# Patient Record
Sex: Male | Born: 1985 | Race: Black or African American | Hispanic: No | Marital: Single | Smoking: Current every day smoker
Health system: Southern US, Community
[De-identification: ages and names within clinical notes are randomized; demographics above are authoritative.]

---
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
## 2007-01-15 ENCOUNTER — Emergency Department (HOSPITAL_COMMUNITY): Admission: EM | Admit: 2007-01-15 | Discharge: 2007-01-16 | Payer: Self-pay | Admitting: Emergency Medicine

## 2007-01-26 ENCOUNTER — Emergency Department (HOSPITAL_COMMUNITY): Admission: EM | Admit: 2007-01-26 | Discharge: 2007-01-26 | Payer: Self-pay | Admitting: Emergency Medicine

## 2007-02-06 ENCOUNTER — Emergency Department (HOSPITAL_COMMUNITY): Admission: EM | Admit: 2007-02-06 | Discharge: 2007-02-07 | Payer: Self-pay | Admitting: Emergency Medicine

## 2007-02-24 ENCOUNTER — Emergency Department (HOSPITAL_COMMUNITY): Admission: EM | Admit: 2007-02-24 | Discharge: 2007-02-24 | Payer: Self-pay | Admitting: Emergency Medicine

## 2007-10-03 ENCOUNTER — Emergency Department (HOSPITAL_COMMUNITY): Admission: EM | Admit: 2007-10-03 | Discharge: 2007-10-03 | Payer: Self-pay | Admitting: Emergency Medicine
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
## 2011-03-01 LAB — RPR: RPR Ser Ql: NONREACTIVE

## 2011-03-17 LAB — RAPID STREP SCREEN (MED CTR MEBANE ONLY): Streptococcus, Group A Screen (Direct): NEGATIVE

## 2011-03-18 LAB — I-STAT 8, (EC8 V) (CONVERTED LAB)
BUN: 9
Bicarbonate: 25.8 — ABNORMAL HIGH
Glucose, Bld: 104 — ABNORMAL HIGH
TCO2: 27
pH, Ven: 7.357 — ABNORMAL HIGH

## 2011-03-18 LAB — URINALYSIS, ROUTINE W REFLEX MICROSCOPIC
Glucose, UA: NEGATIVE
Protein, ur: 30 — AB
Specific Gravity, Urine: 1.027
Urobilinogen, UA: 0.2

## 2011-03-18 LAB — URINE MICROSCOPIC-ADD ON

## 2011-04-17 ENCOUNTER — Encounter: Payer: Self-pay | Admitting: Adult Health

## 2011-04-17 ENCOUNTER — Emergency Department (HOSPITAL_COMMUNITY)
Admission: EM | Admit: 2011-04-17 | Discharge: 2011-04-17 | Disposition: A | Discharge status: Home / Self Care | Payer: Self-pay | Attending: Emergency Medicine | Admitting: Emergency Medicine

## 2011-04-17 ENCOUNTER — Emergency Department (HOSPITAL_COMMUNITY): Payer: Self-pay

## 2011-04-17 DIAGNOSIS — R112 Nausea with vomiting, unspecified: Secondary | ICD-10-CM | POA: Insufficient documentation

## 2011-04-17 DIAGNOSIS — J111 Influenza due to unidentified influenza virus with other respiratory manifestations: Secondary | ICD-10-CM | POA: Insufficient documentation

## 2011-04-17 DIAGNOSIS — B9789 Other viral agents as the cause of diseases classified elsewhere: Secondary | ICD-10-CM | POA: Insufficient documentation

## 2011-04-17 DIAGNOSIS — B349 Viral infection, unspecified: Secondary | ICD-10-CM

## 2011-04-17 MED ORDER — ACETAMINOPHEN 325 MG PO TABS
ORAL_TABLET | ORAL | Status: AC
  Administered 2011-04-17: 22:00:00
  Filled 2011-04-17: qty 3

## 2011-04-17 MED ORDER — IBUPROFEN 800 MG PO TABS
800.0000 mg | ORAL_TABLET | Freq: Once | ORAL | Status: AC
  Administered 2011-04-17: 800 mg via ORAL
  Filled 2011-04-17: qty 1

## 2011-04-17 MED ORDER — ONDANSETRON 8 MG PO TBDP
8.0000 mg | ORAL_TABLET | Freq: Once | ORAL | Status: AC
  Administered 2011-04-17: 8 mg via ORAL
  Filled 2011-04-17: qty 1

## 2011-04-17 MED ORDER — ACETAMINOPHEN 500 MG PO TABS
1000.0000 mg | ORAL_TABLET | Freq: Once | ORAL | Status: AC
  Administered 2011-04-17: 1000 mg via ORAL

## 2011-04-17 NOTE — ED Notes (Signed)
Pt reports vomitting and sore throat and fever since Friday.

## 2011-04-17 NOTE — ED Provider Notes (Signed)
History     CSN: [id number] Arrival date & time: 04/17/2011  8:32 PM   First MD Initiated Contact with Patient 04/17/11 2034      No chief complaint on file.   (Consider location/radiation/quality/duration/timing/severity/associated sxs/prior treatment) Patient is a 25 y.o. male presenting with URI. The history is provided by the patient.  URI The primary symptoms include fever, sore throat, cough, nausea, vomiting and myalgias. Primary symptoms do not include ear pain, swollen glands or rash. The current episode started 3 to 5 days ago.  The sore throat is not accompanied by trouble swallowing.  Symptoms associated with the illness include chills, plugged ear sensation and congestion. The illness is not associated with facial pain or sinus pressure.  Pt states he has had chills, body aches as well. States did not measure temperature at home. Pt denies headache or neck stiffness. Denies abdominal pain. Took over the counter theraflu medications which did not help, last took at 9am this morning.   No past medical history on file.  No past surgical history on file.  No family history on file.  History  Substance Use Topics  . Smoking status: Not on file  . Smokeless tobacco: Not on file  . Alcohol Use: Not on file      Review of Systems  Constitutional: Positive for fever and chills.  HENT: Positive for congestion and sore throat. Negative for ear pain, trouble swallowing, neck pain, neck stiffness, voice change and sinus pressure.   Eyes: Negative.   Respiratory: Positive for cough.   Cardiovascular: Negative.   Gastrointestinal: Positive for nausea and vomiting.  Genitourinary: Negative.   Musculoskeletal: Positive for myalgias.  Skin: Negative for rash.  Neurological: Negative.   Psychiatric/Behavioral: Negative.     Allergies  Review of patient's allergies indicates not on file.  Home Medications  No current outpatient prescriptions on file.  BP 137/85  Pulse  100  Temp(Src) 103 F (39.4 C) (Oral)  Resp 20  SpO2 99%  Physical Exam  Constitutional: He is oriented to person, place, and time. He appears well-developed and well-nourished. He appears distressed.       Uncomfortable appearing  HENT:  Head: Normocephalic and atraumatic.  Eyes: Pupils are equal, round, and reactive to light.  Neck: Neck supple.       No meningismus  Cardiovascular: Normal rate, regular rhythm and normal heart sounds.   Pulmonary/Chest: Effort normal and breath sounds normal. No respiratory distress. He has no wheezes. He has no rales.  Abdominal: Soft. Bowel sounds are normal. There is no tenderness.  Musculoskeletal: Normal range of motion.  Lymphadenopathy:    He has no cervical adenopathy.  Neurological: He is alert and oriented to person, place, and time.  Skin: Skin is warm and dry. No rash noted.    ED Course  Procedures (including critical care time)  Dg Chest 2 View  04/17/2011  *RADIOLOGY REPORT*  Clinical Data: Cough, back pain and fever; history of smoking.  CHEST - 2 VIEW  Comparison: Chest radiograph performed 12/29/2006  Findings: The lungs are well-aerated and clear.  There is no evidence of focal opacification, pleural effusion or pneumothorax.  The heart is normal in size; the mediastinal contour is within normal limits.  No acute osseous abnormalities are seen.  IMPRESSION: No acute cardiopulmonary process seen.  Original Report Authenticated By: Tonia Ghent, M.D.   Pt with flu like symptoms. Temp 103. No neck pain, stiffness, no meningismus. Lungs clear. No tonsillar swelling or exudate. No  cervical adenopathy. Pt given zofran for nausea. Pt able to tolerate fluids in ED. Temp improved with tylenol and motrin. Pt feeling better.  Will d/c home with follow up. Suspect possible influenza.    MDM          Lottie Mussel, PA 04/18/11 [phone]

## 2011-04-21 NOTE — ED Provider Notes (Signed)
Medical screening examination/treatment/procedure(s) were performed by non-physician practitioner and as supervising physician I was immediately available for consultation/collaboration.   Gwyneth Sprout, MD 04/21/11 [phone]

## 2011-12-23 ENCOUNTER — Encounter (HOSPITAL_COMMUNITY): Payer: Self-pay

## 2011-12-23 ENCOUNTER — Emergency Department (HOSPITAL_COMMUNITY)
Admission: EM | Admit: 2011-12-23 | Discharge: 2011-12-23 | Disposition: A | Discharge status: Home / Self Care | Payer: Self-pay | Attending: Emergency Medicine | Admitting: Emergency Medicine

## 2011-12-23 DIAGNOSIS — Z202 Contact with and (suspected) exposure to infections with a predominantly sexual mode of transmission: Secondary | ICD-10-CM | POA: Insufficient documentation

## 2011-12-23 LAB — URINALYSIS, ROUTINE W REFLEX MICROSCOPIC
Glucose, UA: NEGATIVE mg/dL
Nitrite: NEGATIVE
Specific Gravity, Urine: 1.028 (ref 1.005–1.030)
pH: 6.5 (ref 5.0–8.0)

## 2011-12-23 LAB — URINE MICROSCOPIC-ADD ON

## 2011-12-23 LAB — RPR: RPR Ser Ql: NONREACTIVE

## 2011-12-23 MED ORDER — AZITHROMYCIN 250 MG PO TABS
1000.0000 mg | ORAL_TABLET | Freq: Once | ORAL | Status: AC
  Administered 2011-12-23: 1000 mg via ORAL
  Filled 2011-12-23: qty 3
  Filled 2011-12-23: qty 1

## 2011-12-23 MED ORDER — LIDOCAINE HCL (PF) 1 % IJ SOLN
INTRAMUSCULAR | Status: AC
  Administered 2011-12-23: 2 mL
  Filled 2011-12-23: qty 5

## 2011-12-23 MED ORDER — CEFTRIAXONE SODIUM 250 MG IJ SOLR
250.0000 mg | Freq: Once | INTRAMUSCULAR | Status: AC
  Administered 2011-12-23: 250 mg via INTRAMUSCULAR
  Filled 2011-12-23: qty 250

## 2011-12-23 NOTE — ED Notes (Signed)
Pt here for possible std exposure, started after [place] [facility] [place] [facility] sts 3-4 days ago.

## 2011-12-23 NOTE — ED Provider Notes (Signed)
History     CSN: [id number]  Arrival date & time 12/23/11  4098   First MD Initiated Contact with Patient 12/23/11 0840      Chief Complaint  Patient presents with  . SEXUALLY TRANSMITTED DISEASE    (Consider location/radiation/quality/duration/timing/severity/associated sxs/prior treatment) HPI Comments: Patient is a 26 year-old male who presents with a sore throat, resolved testicular pain, and concerns about STD exposure. He first started having a sore throat and testicular pain 5 days ago after having unprotected intercourse with his girlfriend. The testicular pain lasted for a day and a half and was described as a squeezing pain in his testicles. The pain referred to the right and left lower quadrants of his abdomen. The pain resolved and he has not experienced testicular pain in 3 days. The pain in his throat is described as sharp, constant, and a 5 out of 10 on the pain scale. He denies fever and chills. He denies malaise and fatigue. He denies cough and shortness of breath. He denies nausea and vomiting. He denies dysuria, urgency, frequency and hematuria. He denies penile pain and discharge.  The history is provided by the patient.    No past medical history on file.  No past surgical history on file.  No family history on file.  History  Substance Use Topics  . Smoking status: Not on file  . Smokeless tobacco: Not on file  . Alcohol Use: Not on file      Review of Systems  Constitutional: Negative for fever and chills.  HENT: Positive for sore throat. Negative for trouble swallowing.   Respiratory: Negative for cough and shortness of breath.   Cardiovascular: Negative for chest pain.  Gastrointestinal: Negative for nausea, vomiting, abdominal pain and diarrhea.  Genitourinary: Negative for dysuria, discharge, scrotal swelling, penile pain and testicular pain.  All other systems reviewed and are negative.    Allergies  Latex  Home Medications  No current  outpatient prescriptions on file.  BP 129/70  Pulse 74  Temp 98.5 F (36.9 C) (Oral)  Resp 18  SpO2 99%  Physical Exam  Nursing note and vitals reviewed. Constitutional: He is oriented to person, place, and time. He appears well-developed and well-nourished. No distress.  HENT:  Head: Normocephalic and atraumatic.  Mouth/Throat: Oropharynx is clear and moist. No oropharyngeal exudate.  Neck: Neck supple.  Cardiovascular: Normal rate, regular rhythm and normal heart sounds.   Pulmonary/Chest: Breath sounds normal. No respiratory distress. He has no wheezes. He has no rales. He exhibits no tenderness.  Abdominal: Soft. Bowel sounds are normal. He exhibits no distension and no mass. There is no tenderness. There is no rebound and no guarding. Hernia confirmed negative in the right inguinal area and confirmed negative in the left inguinal area.  Genitourinary: Testes normal and penis normal. Right testis shows no mass, no swelling and no tenderness. Left testis shows no mass, no swelling and no tenderness. Circumcised. No penile tenderness. No discharge found.  Lymphadenopathy:       Right: No inguinal adenopathy present.       Left: No inguinal adenopathy present.  Neurological: He is alert and oriented to person, place, and time.  Skin: He is not diaphoretic.    ED Course  Procedures (including critical care time)  Labs Reviewed  URINALYSIS, ROUTINE W REFLEX MICROSCOPIC - Abnormal; Notable for the following:    Color, Urine AMBER (*)  BIOCHEMICALS MAY BE AFFECTED BY COLOR   Leukocytes, UA SMALL (*)  All other components within normal limits  URINE MICROSCOPIC-ADD ON  GC/CHLAMYDIA PROBE AMP, GENITAL  RPR   No results found.   1. Possible exposure to STD       MDM  Patient with sore throat x 5 days and 1.5 days testicular pain (resolved x 3 days) following oral and vaginal intercourse with girlfriend.   Concern for STD exposure.  Pt swabbed for GC/Chlam, treated in ED.   Discussed return precautions.  Pt given STD clinic for further STD and HIV testing.  Pt made aware that his sexual partner(s) should be tested and treated if his tests are positive.          Dillard Cannon [facility]) [place], [place] 12/23/11 1110

## 2011-12-24 NOTE — ED Provider Notes (Signed)
Medical screening examination/treatment/procedure(s) were performed by non-physician practitioner and as supervising physician I was immediately available for consultation/collaboration.   Myrissa Chipley M Desirey Keahey, DO 12/24/11 1018 

## 2012-02-06 ENCOUNTER — Encounter (HOSPITAL_COMMUNITY): Payer: Self-pay | Admitting: Family Medicine

## 2012-02-06 ENCOUNTER — Emergency Department (HOSPITAL_COMMUNITY)
Admission: EM | Admit: 2012-02-06 | Discharge: 2012-02-06 | Disposition: A | Discharge status: Home / Self Care | Payer: No Typology Code available for payment source | Attending: Emergency Medicine | Admitting: Emergency Medicine

## 2012-02-06 DIAGNOSIS — Z043 Encounter for examination and observation following other accident: Secondary | ICD-10-CM | POA: Insufficient documentation

## 2012-02-06 DIAGNOSIS — F17200 Nicotine dependence, unspecified, uncomplicated: Secondary | ICD-10-CM | POA: Insufficient documentation

## 2012-02-06 DIAGNOSIS — M545 Low back pain: Secondary | ICD-10-CM

## 2012-02-06 NOTE — ED Notes (Signed)
No swelling or contusion noted on neck or lower back, pt c/o hitting head on the window without LOC, pt pupils equal & reactive, pt A&O x4, follows commands, speaks in complete sentences
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
## 2012-02-14 ENCOUNTER — Encounter (HOSPITAL_COMMUNITY): Payer: Self-pay | Admitting: *Deleted

## 2012-02-14 ENCOUNTER — Emergency Department (HOSPITAL_COMMUNITY): Payer: No Typology Code available for payment source

## 2012-02-14 ENCOUNTER — Emergency Department (HOSPITAL_COMMUNITY)
Admission: EM | Admit: 2012-02-14 | Discharge: 2012-02-14 | Disposition: A | Discharge status: Home / Self Care | Payer: No Typology Code available for payment source | Attending: Emergency Medicine | Admitting: Emergency Medicine

## 2012-02-14 DIAGNOSIS — Z91040 Latex allergy status: Secondary | ICD-10-CM | POA: Insufficient documentation

## 2012-02-14 DIAGNOSIS — S139XXA Sprain of joints and ligaments of unspecified parts of neck, initial encounter: Secondary | ICD-10-CM

## 2012-02-14 DIAGNOSIS — F17200 Nicotine dependence, unspecified, uncomplicated: Secondary | ICD-10-CM | POA: Insufficient documentation

## 2012-02-14 DIAGNOSIS — S46919A Strain of unspecified muscle, fascia and tendon at shoulder and upper arm level, unspecified arm, initial encounter: Secondary | ICD-10-CM

## 2012-02-14 MED ORDER — NAPROXEN 500 MG PO TABS: 500.0000 mg | ORAL_TABLET | Freq: Two times a day (BID) | ORAL | Status: DC

## 2012-02-14 MED ORDER — NAPROXEN 250 MG PO TABS
500.0000 mg | ORAL_TABLET | Freq: Once | ORAL | Status: AC
  Administered 2012-02-14: 500 mg via ORAL
  Filled 2012-02-14: qty 2

## 2012-02-14 MED ORDER — TRAMADOL HCL 50 MG PO TABS: 50.0000 mg | ORAL_TABLET | Freq: Four times a day (QID) | ORAL | Status: AC | PRN

## 2012-02-14 NOTE — ED Notes (Signed)
Pt states that he feels well enough to "work out" now.
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
## 2012-03-23 ENCOUNTER — Encounter (HOSPITAL_COMMUNITY): Payer: Self-pay | Admitting: Emergency Medicine

## 2012-03-23 ENCOUNTER — Emergency Department (HOSPITAL_COMMUNITY)
Admission: EM | Admit: 2012-03-23 | Discharge: 2012-03-23 | Disposition: A | Discharge status: Home / Self Care | Payer: Self-pay | Attending: Emergency Medicine | Admitting: Emergency Medicine

## 2012-03-23 DIAGNOSIS — S61218A Laceration without foreign body of other finger without damage to nail, initial encounter: Secondary | ICD-10-CM

## 2012-03-23 DIAGNOSIS — Y9389 Activity, other specified: Secondary | ICD-10-CM | POA: Insufficient documentation

## 2012-03-23 DIAGNOSIS — W268XXA Contact with other sharp object(s), not elsewhere classified, initial encounter: Secondary | ICD-10-CM | POA: Insufficient documentation

## 2012-03-23 DIAGNOSIS — F17200 Nicotine dependence, unspecified, uncomplicated: Secondary | ICD-10-CM | POA: Insufficient documentation

## 2012-03-23 DIAGNOSIS — Y998 Other external cause status: Secondary | ICD-10-CM | POA: Insufficient documentation

## 2012-03-23 DIAGNOSIS — S61209A Unspecified open wound of unspecified finger without damage to nail, initial encounter: Secondary | ICD-10-CM | POA: Insufficient documentation

## 2012-03-23 NOTE — ED Notes (Signed)
Suture cart to bedside. 

## 2012-03-23 NOTE — ED Provider Notes (Signed)
History     CSN: [id number]  Arrival date & time 03/23/12  4098   First MD Initiated Contact with Patient 03/23/12 0136      Chief Complaint  Patient presents with  . Finger Injury   HPI  History provided by the patient. Patient is a 26 year old male with no significant PMH who presents with complaints of laceration to left index finger. Patient was working on project and was using a razor and slipped making a small cut to his lateral left index finger. He denies any weakness or numbness. There was some associated bleeding was controlled with bandage. Patient also treated injury with washing under the sink. He denies any other complaints or injury. Patient believes he had his last tetanus 3 or 4 years ago.     History reviewed. No pertinent past medical history.  History reviewed. No pertinent past surgical history.  No family history on file.  History  Substance Use Topics  . Smoking status: Current Every Day Smoker  . Smokeless tobacco: Not on file  . Alcohol Use: Yes      Review of Systems  Skin:       Laceration to left index finger  Neurological: Negative for weakness and numbness.    Allergies  Latex  Home Medications  No current outpatient prescriptions on file.  BP 125/71  Temp 98.3 F (36.8 C) (Oral)  Resp 18  SpO2 99%  Physical Exam  Nursing note and vitals reviewed. Constitutional: He is oriented to person, place, and time. He appears well-developed and well-nourished. No distress.  HENT:  Head: Normocephalic.  Cardiovascular: Normal rate and regular rhythm.   Pulmonary/Chest: Effort normal and breath sounds normal.  Musculoskeletal:       Laceration to the lateral left index finger. Wound explored with no deep structure involvement through full range of motion. Normal motion. Normal strength against resistance. Normal cap refill. Patient reports slightly decreased sensation to the lateral aspect of the finger distally. Patient still has sharp  and dull touch discrimination.  Neurological: He is alert and oriented to person, place, and time.  Skin: Skin is warm.  Psychiatric: He has a normal mood and affect. His behavior is normal.    ED Course  Procedures  LACERATION REPAIR Performed by: Angus Seller Authorized by: Angus Seller Consent: Verbal consent obtained. Risks and benefits: risks, benefits and alternatives were discussed Consent given by: patient Patient identity confirmed: provided demographic data Prepped and Draped in normal sterile fashion Wound explored  Laceration Location: Left index finger  Laceration Length: 3.5 cm  No Foreign Bodies seen or palpated  Anesthesia: Digital block   Local anesthetic: lidocaine 2% without epinephrine  Anesthetic total: 2 ml  Irrigation method: syringe Amount of cleaning: standard  Skin closure: Skin with 4-0 Prolene   Number of sutures: 3   Technique: Simple interrupted   Patient tolerance: Patient tolerated the procedure well with no immediate complications.      1. Laceration of index finger       MDM  Patient seen and evaluated. Patient appears comfortable no acute distress.        Angus Seller, [place] 03/23/12 [phone]

## 2012-03-23 NOTE — ED Notes (Signed)
PT. REPORTS LEFT INDEX FINGER LACERATION APPROX. 1 INCH SUSTAINED FROM A RAZOR WHILE AT WORK THIS EVENING , NO BLEEDING AT TRIAGE , DRESSING APPLIED PTA.

## 2012-03-23 NOTE — ED Provider Notes (Addendum)
Medical screening examination/treatment/procedure(s) were conducted as a shared visit with non-physician practitioner(s) and myself.   Brandt Loosen, MD 03/23/12 0800  Brandt Loosen, MD 03/23/12 [phone]

## 2012-03-23 NOTE — ED Provider Notes (Signed)
Medical screening examination/treatment/procedure(s) were conducted as a shared visit with non-physician practitioner(s) and myself.    Brandt Loosen, MD 03/23/12 [phone]

## 2012-04-21 ENCOUNTER — Encounter (HOSPITAL_COMMUNITY): Payer: Self-pay | Admitting: Emergency Medicine

## 2012-04-21 ENCOUNTER — Emergency Department (HOSPITAL_COMMUNITY)
Admission: EM | Admit: 2012-04-21 | Discharge: 2012-04-21 | Disposition: A | Discharge status: Home / Self Care | Payer: Self-pay | Attending: Emergency Medicine | Admitting: Emergency Medicine

## 2012-04-21 DIAGNOSIS — F17200 Nicotine dependence, unspecified, uncomplicated: Secondary | ICD-10-CM | POA: Insufficient documentation

## 2012-04-21 DIAGNOSIS — R300 Dysuria: Secondary | ICD-10-CM | POA: Insufficient documentation

## 2012-04-21 DIAGNOSIS — N342 Other urethritis: Secondary | ICD-10-CM | POA: Insufficient documentation

## 2012-04-21 MED ORDER — METRONIDAZOLE 500 MG PO TABS
2000.0000 mg | ORAL_TABLET | Freq: Once | ORAL | Status: AC
  Administered 2012-04-21: 2000 mg via ORAL
  Filled 2012-04-21: qty 4

## 2012-04-21 MED ORDER — ONDANSETRON 4 MG PO TBDP
8.0000 mg | ORAL_TABLET | Freq: Once | ORAL | Status: AC
  Administered 2012-04-21: 8 mg via ORAL
  Filled 2012-04-21: qty 2
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
## 2012-04-21 NOTE — ED Provider Notes (Signed)
History     CSN: [id number]  Arrival date & time 04/21/12  7829   First MD Initiated Contact with Patient 04/21/12 0600      Chief Complaint  Patient presents with  . Penile Discharge    Patient is a 26 y.o. male presenting with dysuria. The history is provided by the patient.  Dysuria  This is a new problem. The current episode started more than 1 week ago. The problem occurs intermittently. The problem has not changed since onset.The quality of the pain is described as burning. The pain is mild. There has been no fever. Associated symptoms include discharge.  pt reports he has noticed some dysuria recently with small amt of penile discharge He reports his girlfriend called him and told him she was diagnosed with trichomonas He presents today for treatment  PMH - none  History reviewed. No pertinent past surgical history.  History reviewed. No pertinent family history.  History  Substance Use Topics  . Smoking status: Current Every Day Smoker  . Smokeless tobacco: Not on file  . Alcohol Use: Yes      Review of Systems  Constitutional: Negative for fever.  Genitourinary: Positive for dysuria.    Allergies  Latex  Home Medications  No current outpatient prescriptions on file.  BP 140/69  Pulse 81  Temp 98 F (36.7 C) (Oral)  Resp 16  SpO2 100%  Physical Exam CONSTITUTIONAL: Well developed/well nourished HEAD AND FACE: Normocephalic/atraumatic EYES: EOMI ENMT: Mucous membranes moist NECK: supple no meningeal signs CV: S1/S2 noted, no murmurs/rubs/gallops noted LUNGS: Lungs are clear to auscultation bilaterally, no apparent distress ABDOMEN: soft, nontender, no rebound or guarding GU:no penile discharge.  No testicular tenderness.  Chaperone present NEURO: Pt is awake/alert, moves all extremitiesx4 EXTREMITIES: pulses normal, full ROM SKIN: warm, color normal   ED Course  Procedures    Labs Reviewed  GC/CHLAMYDIA PROBE AMP    1. Urethritis        MDM  Nursing notes including past medical history and social history reviewed and considered in documentation  GC/chlam test sent.  Will empirically tx for trichomonas here with flagyll.  I advised him to avoid using ETOH while taking this medication        Joya Gaskins, MD 04/21/12 [phone]

## 2012-04-21 NOTE — ED Notes (Signed)
Came back to room with discharge papers in hand. Pt. Was not in room. Pt. Did not receive discharge paperwork. E- signature not obtained.

## 2013-01-01 ENCOUNTER — Emergency Department (HOSPITAL_COMMUNITY)
Admission: EM | Admit: 2013-01-01 | Discharge: 2013-01-01 | Disposition: A | Discharge status: Home / Self Care | Payer: Self-pay | Attending: Emergency Medicine | Admitting: Emergency Medicine

## 2013-01-01 ENCOUNTER — Encounter (HOSPITAL_COMMUNITY): Payer: Self-pay | Admitting: Emergency Medicine

## 2013-01-01 DIAGNOSIS — R52 Pain, unspecified: Secondary | ICD-10-CM | POA: Insufficient documentation

## 2013-01-01 DIAGNOSIS — H9209 Otalgia, unspecified ear: Secondary | ICD-10-CM | POA: Insufficient documentation

## 2013-01-01 DIAGNOSIS — H9201 Otalgia, right ear: Secondary | ICD-10-CM

## 2013-01-01 DIAGNOSIS — Z77098 Contact with and (suspected) exposure to other hazardous, chiefly nonmedicinal, chemicals: Secondary | ICD-10-CM | POA: Insufficient documentation

## 2013-01-01 DIAGNOSIS — F17200 Nicotine dependence, unspecified, uncomplicated: Secondary | ICD-10-CM | POA: Insufficient documentation

## 2013-01-01 DIAGNOSIS — Z91040 Latex allergy status: Secondary | ICD-10-CM | POA: Insufficient documentation

## 2013-01-01 NOTE — ED Provider Notes (Signed)
Medical screening examination/treatment/procedure(s) were performed by non-physician practitioner and as supervising physician I was immediately available for consultation/collaboration.  Doug Sou, MD 01/01/13 [phone]
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
## 2013-02-03 ENCOUNTER — Emergency Department (HOSPITAL_COMMUNITY)
Admission: EM | Admit: 2013-02-03 | Discharge: 2013-02-03 | Disposition: A | Discharge status: Other Acute Inpatient Hospital | Payer: Self-pay | Attending: Emergency Medicine | Admitting: Emergency Medicine

## 2013-02-03 ENCOUNTER — Encounter (HOSPITAL_COMMUNITY): Payer: Self-pay | Admitting: Emergency Medicine

## 2013-02-03 DIAGNOSIS — E876 Hypokalemia: Secondary | ICD-10-CM | POA: Insufficient documentation

## 2013-02-03 DIAGNOSIS — F29 Unspecified psychosis not due to a substance or known physiological condition: Secondary | ICD-10-CM | POA: Insufficient documentation

## 2013-02-03 DIAGNOSIS — F1210 Cannabis abuse, uncomplicated: Secondary | ICD-10-CM | POA: Insufficient documentation

## 2013-02-03 DIAGNOSIS — F1220 Cannabis dependence, uncomplicated: Secondary | ICD-10-CM

## 2013-02-03 DIAGNOSIS — F17200 Nicotine dependence, unspecified, uncomplicated: Secondary | ICD-10-CM | POA: Insufficient documentation

## 2013-02-03 DIAGNOSIS — Z87891 Personal history of nicotine dependence: Secondary | ICD-10-CM | POA: Insufficient documentation

## 2013-02-03 LAB — ETHANOL: Alcohol, Ethyl (B): 78 mg/dL — ABNORMAL HIGH (ref 0–11)

## 2013-02-03 LAB — CBC WITH DIFFERENTIAL/PLATELET
Basophils Absolute: 0 10*3/uL (ref 0.0–0.1)
Eosinophils Absolute: 0.1 10*3/uL (ref 0.0–0.7)
Eosinophils Relative: 1 % (ref 0–5)
HCT: 38.5 % — ABNORMAL LOW (ref 39.0–52.0)
Lymphocytes Relative: 37 % (ref 12–46)
MCH: 28.9 pg (ref 26.0–34.0)
MCV: 83 fL (ref 78.0–100.0)
Monocytes Absolute: 0.8 10*3/uL (ref 0.1–1.0)
RDW: 13.3 % (ref 11.5–15.5)
WBC: 9.8 10*3/uL (ref 4.0–10.5)

## 2013-02-03 LAB — RAPID URINE DRUG SCREEN, HOSP PERFORMED
Amphetamines: NOT DETECTED
Benzodiazepines: NOT DETECTED
Cocaine: NOT DETECTED
Opiates: NOT DETECTED

## 2013-02-03 LAB — COMPREHENSIVE METABOLIC PANEL
AST: 29 U/L (ref 0–37)
CO2: 25 mEq/L (ref 19–32)
Calcium: 8.9 mg/dL (ref 8.4–10.5)
Creatinine, Ser: 1.16 mg/dL (ref 0.50–1.35)
GFR calc Af Amer: >90 mL/min (ref >90)
GFR calc non Af Amer: 86 mL/min — ABNORMAL LOW (ref >90)
Glucose, Bld: 90 mg/dL (ref 70–99)
Total Protein: 7.4 g/dL (ref 6.0–8.3)

## 2013-02-03 MED ORDER — POTASSIUM CHLORIDE CRYS ER 20 MEQ PO TBCR
40.0000 meq | EXTENDED_RELEASE_TABLET | Freq: Once | ORAL | Status: AC
  Administered 2013-02-03: 40 meq via ORAL
  Filled 2013-02-03: qty 2

## 2013-02-03 MED ORDER — NICOTINE 14 MG/24HR TD PT24
14.0000 mg | MEDICATED_PATCH | Freq: Once | TRANSDERMAL | Status: DC
  Administered 2013-02-03: 14 mg via TRANSDERMAL
  Filled 2013-02-03: qty 1
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
## 2013-02-03 NOTE — BH Assessment (Signed)
[facility] Assessment Progress Note Called following facilities in attempt to place pt:  -[place] - no beds per [facility] @ 0959 Earlene Plater - no beds per [place] @ [phone] -[facility] - no beds per Susie @ 1000 -[facility] - no beds per [place] @ 1001 -[facility] - Beds per [place] @ 1002 - Referral faxed for review - [facility] - 1006 - may have beds after 1300 - Referral faxed for review - [facility] - 1004 - No beds per Geri Seminole, but can send referral for consideration for their wait list - Referral faxed for review - [facility] - only male beds per Selena Batten @ 1005 Turner Daniels - beds @ [phone] - Referral faxed for review - [facility] - no beds @ 1007, but can send referral for consideration for wait list per Baxter Hire - Referral faxed for review - [place] - no beds per Katie @ 1003 - [place] - No beds per [place] @ [address] - Left message @ 1012 - [place] - No beds per [facility] @ 1034 - [place] - no beds per Olegario Messier @ 1037 [facility] - 1057 - no beds per [facility]  TTS staff will follow up with referrals.

## 2013-02-03 NOTE — ED Notes (Signed)
Belongings with RN at this time to be inventoried.

## 2013-02-03 NOTE — ED Notes (Signed)
Pt. requesting psychiatric evaluation , pt.stated " I have a lot of things in my mind " , denies suicidal ideation , no visual or auditory hallucinations.
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
## 2016-07-02 ENCOUNTER — Encounter (HOSPITAL_COMMUNITY): Payer: Self-pay

## 2016-07-02 ENCOUNTER — Emergency Department (HOSPITAL_COMMUNITY)
Admission: EM | Admit: 2016-07-02 | Discharge: 2016-07-02 | Disposition: A | Discharge status: Home / Self Care | Payer: Self-pay | Attending: Emergency Medicine | Admitting: Emergency Medicine

## 2016-07-02 DIAGNOSIS — Z91040 Latex allergy status: Secondary | ICD-10-CM | POA: Insufficient documentation

## 2016-07-02 DIAGNOSIS — F10120 Alcohol abuse with intoxication, uncomplicated: Secondary | ICD-10-CM

## 2016-07-02 DIAGNOSIS — F10129 Alcohol abuse with intoxication, unspecified: Secondary | ICD-10-CM | POA: Insufficient documentation

## 2016-07-02 DIAGNOSIS — F17200 Nicotine dependence, unspecified, uncomplicated: Secondary | ICD-10-CM | POA: Insufficient documentation

## 2016-07-02 LAB — CBC WITH DIFFERENTIAL/PLATELET
BASOS ABS: 0 10*3/uL (ref 0.0–0.1)
Basophils Relative: 0 %
EOS PCT: 0 %
Eosinophils Absolute: 0 10*3/uL (ref 0.0–0.7)
HCT: 43.9 % (ref 39.0–52.0)
Hemoglobin: 14.8 g/dL (ref 13.0–17.0)
LYMPHS ABS: 2 10*3/uL (ref 0.7–4.0)
LYMPHS PCT: 22 %
MCH: 27.6 pg (ref 26.0–34.0)
MCHC: 33.7 g/dL (ref 30.0–36.0)
MCV: 81.9 fL (ref 78.0–100.0)
MONO ABS: 0.4 10*3/uL (ref 0.1–1.0)
Monocytes Relative: 4 %
NEUTROS ABS: 6.5 10*3/uL (ref 1.7–7.7)
Neutrophils Relative %: 74 %
PLATELETS: 352 10*3/uL (ref 150–400)
RBC: 5.36 MIL/uL (ref 4.22–5.81)
RDW: 13.5 % (ref 11.5–15.5)
WBC: 9 10*3/uL (ref 4.0–10.5)

## 2016-07-02 LAB — COMPREHENSIVE METABOLIC PANEL
ALT: 22 U/L (ref 17–63)
AST: 35 U/L (ref 15–41)
Albumin: 4.8 g/dL (ref 3.5–5.0)
Alkaline Phosphatase: 89 U/L (ref 38–126)
Anion gap: 14 (ref 5–15)
BILIRUBIN TOTAL: 0.6 mg/dL (ref 0.3–1.2)
BUN: 9 mg/dL (ref 6–20)
CALCIUM: 9.9 mg/dL (ref 8.9–10.3)
CO2: 22 mmol/L (ref 22–32)
CREATININE: 1.11 mg/dL (ref 0.61–1.24)
Chloride: 104 mmol/L (ref 101–111)
GFR calc Af Amer: >60 mL/min (ref >60)
Glucose, Bld: 103 mg/dL — ABNORMAL HIGH (ref 65–99)
Potassium: 3.6 mmol/L (ref 3.5–5.1)
Sodium: 140 mmol/L (ref 135–145)
TOTAL PROTEIN: 8.4 g/dL — AB (ref 6.5–8.1)

## 2016-07-02 LAB — LIPASE, BLOOD: LIPASE: 16 U/L (ref 11–51)

## 2016-07-02 MED ORDER — ONDANSETRON 4 MG PO TBDP: 4.0000 mg | ORAL_TABLET | Freq: Three times a day (TID) | ORAL | 0 refills | Status: AC | PRN

## 2016-07-02 MED ORDER — SODIUM CHLORIDE 0.9 % IV BOLUS (SEPSIS)
1000.0000 mL | Freq: Once | INTRAVENOUS | Status: AC
  Administered 2016-07-02: 1000 mL via INTRAVENOUS

## 2016-07-02 MED ORDER — ONDANSETRON HCL 4 MG/2ML IJ SOLN
4.0000 mg | Freq: Once | INTRAMUSCULAR | Status: AC
  Administered 2016-07-02: 4 mg via INTRAVENOUS
  Filled 2016-07-02: qty 2

## 2016-07-02 NOTE — ED Provider Notes (Signed)
[facility]EMERGENCY DEPT Provider Note   CSN: [id number] Arrival date & time: 07/02/16  0917     History   Chief Complaint Chief Complaint  Patient presents with  . etoh/nausea    HPI Tony Moore is a 31 y.o. male.  HPI   31 year old male presents with concern for nausea and vomiting, dizziness after drinking alcohol all night. Friend at the bedside reports he started drinking at approximately 8 PM, and there are drinking until approximately 5 AM. Round 5 AM he vomited once, and then 1 hour later he developed hyperventilation and was vomiting again, several times. Patient reports dizziness and abdominal pain. Friend brought him in given his hyperventilation, abdominal pain, dizziness with concern for "alcohol poisoning"  they deny other drug use, medication use. Denied trauma.  History reviewed. No pertinent past medical history.  There are no active problems to display for this patient.   History reviewed. No pertinent surgical history.     Home Medications    Prior to Admission medications   Medication Sig Start Date End Date Taking? Authorizing Provider  ondansetron (ZOFRAN ODT) 4 MG disintegrating tablet Take 1 tablet (4 mg total) by mouth every 8 (eight) hours as needed for nausea or vomiting. 07/02/16   Alvira MondayErin Micajah Dennin, MD    Family History No family history on file.  Social History Social History  Substance Use Topics  . Smoking status: Current Every Day Smoker  . Smokeless tobacco: Not on file  . Alcohol use Yes     Allergies   Pork-derived products and Latex   Review of Systems Review of Systems  Constitutional: Negative for fever.  HENT: Negative for sore throat.   Eyes: Negative for visual disturbance.  Respiratory: Negative for shortness of breath (was hyperventilating but no longer).   Cardiovascular: Negative for chest pain.  Gastrointestinal: Positive for abdominal pain, nausea and vomiting.  Genitourinary: Negative for difficulty  urinating.  Musculoskeletal: Negative for back pain and neck stiffness.  Skin: Negative for rash.  Neurological: Positive for dizziness. Negative for syncope.     Physical Exam Updated Vital Signs BP 124/80   Pulse (!) 47   Temp 97.3 F (36.3 C) (Oral)   Resp 16   SpO2 99%   Physical Exam  Constitutional: He is oriented to person, place, and time. He appears well-developed and well-nourished. He appears ill. No distress.  In bed with eyes closed  HENT:  Head: Normocephalic and atraumatic.  Eyes: Conjunctivae and EOM are normal.  Neck: Normal range of motion.  Cardiovascular: Normal rate, regular rhythm, normal heart sounds and intact distal pulses.  Exam reveals no gallop and no friction rub.   No murmur heard. Pulmonary/Chest: Effort normal and breath sounds normal. No respiratory distress. He has no wheezes. He has no rales.  Abdominal: Soft. He exhibits no distension. There is no tenderness (reports palpation makes him nauseas). There is no guarding.  Musculoskeletal: He exhibits no edema.  Neurological: He is alert and oriented to person, place, and time.  Skin: Skin is warm and dry. He is not diaphoretic.  Nursing note and vitals reviewed.    ED Treatments / Results  Labs (all labs ordered are listed, but only abnormal results are displayed) Labs Reviewed  COMPREHENSIVE METABOLIC PANEL - Abnormal; Notable for the following:       Result Value   Glucose, Bld 103 (*)    Total Protein 8.4 (*)    All other components within normal limits  CBC WITH DIFFERENTIAL/PLATELET  LIPASE,  BLOOD    EKG  EKG Interpretation None       Radiology No results found.  Procedures Procedures (including critical care time)  Medications Ordered in ED Medications  sodium chloride 0.9 % bolus 1,000 mL (0 mLs Intravenous Stopped 07/02/16 1143)  ondansetron (ZOFRAN) injection 4 mg (4 mg Intravenous Given 07/02/16 1033)     Initial Impression / Assessment and Plan / ED Course    I have reviewed the triage vital signs and the nursing notes.  Pertinent labs & imaging results that were available during my care of the patient were reviewed by me and considered in my medical decision making (see chart for details).     31 year old male with hx of psychosis presents with concern for nausea and vomiting, dizziness after drinking alcohol last night until 5AM.  Patient reportedly had been hyperventilating prior to arrival, however is not on my exam. Suspect this is likely secondary to his nausea. Labs are obtained given his abdominal pain to evaluate for signs of alcoholic hepatitis or pancreatitis and showed no acute abnormalities. Patient was given IV fluids, Zofran. Suspect his symptoms are veisalgia (or secondary to a hangover.)  Patient feels improved after fluids and zofran. Labs WNL.  Will discharge home with rx for zofran, recommend continued hydration, avoidance of etoh/binging. Pt denies SI/HI/hallucinations.   Final Clinical Impressions(s) / ED Diagnoses   Final diagnoses:  Hangover without complication (HCC)    New Prescriptions New Prescriptions   ONDANSETRON (ZOFRAN ODT) 4 MG DISINTEGRATING TABLET    Take 1 tablet (4 mg total) by mouth every 8 (eight) hours as needed for nausea or vomiting.     Alvira Monday, MD 07/02/16 1147

## 2016-07-02 NOTE — ED Notes (Signed)
Papers reviewed with friend of patient because patient is not cooperative with answering questions or acknowledging papers. Patient and friend removed IV before we came in for discharge. He denies pain by shaking his head no and is leaving with friend

## 2016-07-02 NOTE — ED Triage Notes (Signed)
Patient here with heavy drinking since last night with nausea, hyperventilating on arrival. Alert and oriented, crying on arrival

## 2017-03-16 ENCOUNTER — Encounter (HOSPITAL_COMMUNITY): Payer: Self-pay

## 2017-03-16 ENCOUNTER — Emergency Department (HOSPITAL_COMMUNITY)
Admission: EM | Admit: 2017-03-16 | Discharge: 2017-03-16 | Disposition: A | Discharge status: Home / Self Care | Payer: No Typology Code available for payment source | Attending: Emergency Medicine | Admitting: Emergency Medicine

## 2017-03-16 DIAGNOSIS — F17200 Nicotine dependence, unspecified, uncomplicated: Secondary | ICD-10-CM | POA: Diagnosis not present

## 2017-03-16 DIAGNOSIS — H1089 Other conjunctivitis: Secondary | ICD-10-CM | POA: Insufficient documentation

## 2017-03-16 DIAGNOSIS — H5711 Ocular pain, right eye: Secondary | ICD-10-CM | POA: Diagnosis present

## 2017-03-16 DIAGNOSIS — H1031 Unspecified acute conjunctivitis, right eye: Secondary | ICD-10-CM

## 2017-03-16 MED ORDER — TETRACAINE HCL 0.5 % OP SOLN
1.0000 [drp] | Freq: Once | OPHTHALMIC | Status: AC
  Administered 2017-03-16: 1 [drp] via OPHTHALMIC
  Filled 2017-03-16: qty 4

## 2017-03-16 MED ORDER — FLUORESCEIN SODIUM 1 MG OP STRP
1.0000 | ORAL_STRIP | Freq: Once | OPHTHALMIC | Status: AC
  Administered 2017-03-16: 1 via OPHTHALMIC
  Filled 2017-03-16: qty 1

## 2017-03-16 MED ORDER — ERYTHROMYCIN 5 MG/GM OP OINT
1.0000 "application " | TOPICAL_OINTMENT | Freq: Three times a day (TID) | OPHTHALMIC | Status: DC
  Administered 2017-03-16: 1 via OPHTHALMIC
  Filled 2017-03-16: qty 3.5

## 2017-03-16 NOTE — ED Triage Notes (Signed)
Pt reports conjunctivitis in the right eye. Redness noted. Reports some drainage this morning.

## 2017-03-16 NOTE — ED Provider Notes (Signed)
[facility] Provider Note   CSN: [id number] Arrival date & time: 03/16/17  1533     History   Chief Complaint Chief Complaint  Patient presents with  . Conjunctivitis    HPI Tony Moore is a 31 y.o. male who presents to the ED with right eye redness, drainage and itching. He also states that it feels as if something is in the eye but doesn't remember getting anything in it.  Patient reports that he felt itching last night and then this morning he noted redness and drainage. Patient has not been around anyone with similar symptoms. Patient does report nasal congestion and slight cough and congestion.  The history is provided by the patient. No language interpreter was used.  Conjunctivitis  This is a new problem. The current episode started yesterday. The problem occurs constantly. The problem has been gradually worsening. Pertinent negatives include no chest pain, no abdominal pain and no headaches. Nothing aggravates the symptoms. Nothing relieves the symptoms. He has tried a cold compress for the symptoms. The treatment provided no relief.    History reviewed. No pertinent past medical history.  There are no active problems to display for this patient.   History reviewed. No pertinent surgical history.     Home Medications    Prior to Admission medications   Medication Sig Start Date End Date Taking? Authorizing Provider  ondansetron (ZOFRAN ODT) 4 MG disintegrating tablet Take 1 tablet (4 mg total) by mouth every 8 (eight) hours as needed for nausea or vomiting. 07/02/16   Alvira Monday, MD    Family History History reviewed. No pertinent family history.  Social History Social History  Substance Use Topics  . Smoking status: Current Every Day Smoker  . Smokeless tobacco: Never Used  . Alcohol use Yes     Allergies   Pork-derived products and Latex   Review of Systems Review of Systems  Constitutional: Negative for chills and fever.  HENT:  Positive for congestion, ear pain and sore throat (irritation).   Eyes: Positive for photophobia, discharge, redness and itching. Visual disturbance: occasionally blurry.  Respiratory: Positive for cough.   Cardiovascular: Negative for chest pain.  Gastrointestinal: Negative for abdominal pain, nausea and vomiting.  Genitourinary: Negative for dysuria, frequency and urgency.  Musculoskeletal: Positive for myalgias.  Skin: Negative for rash.  Neurological: Negative for dizziness, syncope and headaches.  Hematological: Negative for adenopathy.  Psychiatric/Behavioral: Negative for confusion. The patient is not nervous/anxious.      Physical Exam Updated Vital Signs BP 113/64 (BP Location: Right Arm)   Pulse 75   Temp 98.6 F (37 C) (Oral)   Resp 19   SpO2 99%   Physical Exam  Constitutional: He is oriented to person, place, and time. He appears well-developed and well-nourished. No distress.  HENT:  Head: Normocephalic and atraumatic.  Right Ear: Tympanic membrane normal.  Left Ear: Tympanic membrane normal.  Nose: Rhinorrhea present.  Mouth/Throat: Uvula is midline and mucous membranes are normal. No posterior oropharyngeal edema. Posterior oropharyngeal erythema: mild.  Eyes: Pupils are equal, round, and reactive to light. EOM are normal. Lids are everted and swept, no foreign bodies found. Right eye exhibits exudate. Right eye exhibits no hordeolum. No foreign body present in the right eye. Right conjunctiva is injected.  Slit lamp exam:      The right eye shows no corneal abrasion, no corneal ulcer and no fluorescein uptake.  Neck: Normal range of motion. Neck supple.  Cardiovascular: Normal rate and regular rhythm.  Pulmonary/Chest: Effort normal and breath sounds normal.  Abdominal: Soft. There is no tenderness.  Musculoskeletal: Normal range of motion.  Lymphadenopathy:    He has no cervical adenopathy.  Neurological: He is alert and oriented to person, place, and time.  No cranial nerve deficit.  Skin: Skin is warm and dry.  Psychiatric: He has a normal mood and affect. His behavior is normal.  Nursing note and vitals reviewed.    ED Treatments / Results  Labs (all labs ordered are listed, but only abnormal results are displayed) Labs Reviewed - No data to display  Radiology No results found.  Procedures Procedures (including critical care time)  Medications Ordered in ED Medications  fluorescein ophthalmic strip 1 strip (not administered)  tetracaine (PONTOCAINE) 0.5 % ophthalmic solution 1 drop (not administered)  erythromycin ophthalmic ointment 1 application (not administered)     Initial Impression / Assessment and Plan / ED Course  I have reviewed the triage vital signs and the nursing notes.  Tony Moore presents with symptoms consistent with bacterial conjunctivitis.  Purulent discharge on exam.  No corneal abrasions, entrapment, consensual photophobia, or dendritic staining with fluorescein study.  Presentation non-concerning for iritis, corneal abrasions, or HSV.  No evidence of preseptal or orbital cellulitis.  Pt is not a contact lens wearer.  Patient will be given erythromycin ophthalmic.  Personal hygiene and frequent handwashing discussed.  Patient advised to followup with ophthalmologist for reevaluation in several days..  Patient verbalizes understanding and is agreeable with discharge.     Final Clinical Impressions(s) / ED Diagnoses   Final diagnoses:  Acute bacterial conjunctivitis of right eye    New Prescriptions New Prescriptions   No medications on file     Tony Buffalo Rice, NP 03/16/17 Tony Moore    Tony Kaplan, MD 03/17/17 [phone]

## 2017-03-16 NOTE — Discharge Instructions (Signed)
Follow up with Dr. Allena Katz if symptoms are not improving over the next couple days. Return here as needed.

## 2017-03-24 ENCOUNTER — Encounter (HOSPITAL_COMMUNITY): Payer: Self-pay

## 2017-03-24 DIAGNOSIS — H5789 Other specified disorders of eye and adnexa: Secondary | ICD-10-CM | POA: Insufficient documentation

## 2017-03-24 DIAGNOSIS — H53149 Visual discomfort, unspecified: Secondary | ICD-10-CM | POA: Insufficient documentation

## 2017-03-24 DIAGNOSIS — F17200 Nicotine dependence, unspecified, uncomplicated: Secondary | ICD-10-CM | POA: Insufficient documentation

## 2017-03-24 DIAGNOSIS — Z79899 Other long term (current) drug therapy: Secondary | ICD-10-CM | POA: Insufficient documentation

## 2017-03-24 DIAGNOSIS — Z91040 Latex allergy status: Secondary | ICD-10-CM | POA: Insufficient documentation

## 2017-03-24 NOTE — ED Triage Notes (Signed)
Pt states he was seen here 7 days ago for pink eye and given med; pt state eye is not getting better and causing other eye to water and new onset of HA at 6/10; pt a&o 4; pt arrives with shades-Monique,RN

## 2017-03-25 ENCOUNTER — Emergency Department (HOSPITAL_COMMUNITY)
Admission: EM | Admit: 2017-03-25 | Discharge: 2017-03-25 | Disposition: A | Discharge status: Home / Self Care | Payer: Self-pay | Attending: Emergency Medicine | Admitting: Emergency Medicine

## 2017-03-25 DIAGNOSIS — H5789 Other specified disorders of eye and adnexa: Secondary | ICD-10-CM

## 2017-03-25 MED ORDER — FLUORESCEIN SODIUM 1 MG OP STRP
1.0000 | ORAL_STRIP | Freq: Once | OPHTHALMIC | Status: AC
  Administered 2017-03-25: 1 via OPHTHALMIC
  Filled 2017-03-25: qty 1

## 2017-03-25 NOTE — Discharge Instructions (Signed)
See Dr. Dione BoozeGroat in his office at 1PM today. If you have any problems, you can call him on his cell phone [phone].

## 2017-03-25 NOTE — ED Provider Notes (Signed)
[facility]   CSN: [id number] Arrival date & time: 03/24/17  2026     History   Chief Complaint Chief Complaint  Patient presents with  . Conjunctivitis  . Headache    HPI Tony Moore is a 31 y.o. male.  The history is provided by the patient.  He complains of ongoing pain and irritation of the right eye.  He was seen in ED on October 11 and diagnosed with conjunctivitis and given a prescription for erythromycin ophthalmic ointment.  He states that since then, his eye has gotten worse.  The redness is gotten worse and it has become painful.  He rates pain at 8/10.  There is photophobia.  He continues to have drainage from the eye.  History reviewed. No pertinent past medical history.  There are no active problems to display for this patient.   History reviewed. No pertinent surgical history.     Home Medications    Prior to Admission medications   Medication Sig Start Date End Date Taking? Authorizing Provider  ondansetron (ZOFRAN ODT) 4 MG disintegrating tablet Take 1 tablet (4 mg total) by mouth every 8 (eight) hours as needed for nausea or vomiting. 07/02/16   Alvira MondaySchlossman, Erin, MD    Family History History reviewed. No pertinent family history.  Social History Social History  Substance Use Topics  . Smoking status: Current Every Day Smoker  . Smokeless tobacco: Never Used  . Alcohol use Yes     Allergies   Pork-derived products and Latex   Review of Systems Review of Systems  All other systems reviewed and are negative.    Physical Exam Updated Vital Signs BP (!) 114/55 (BP Location: Right Arm)   Pulse 66   Temp 98.1 F (36.7 C) (Oral)   Resp 15   SpO2 99%   Physical Exam  Nursing note and vitals reviewed.  31 year old male, resting comfortably and in no acute distress. Vital signs are normal. Oxygen saturation is 99%, which is normal. Head is normocephalic and atraumatic. PERRLA,  EOMI. Conjunctiva of the right eye is intensely injected.  Anterior chamber is clear.  Photophobia is present to direct light, but only minimally to consensual light.  Slit lamp exam shows no foreign bodies, clear anterior chamber.  Fluorescein exam shows no abnormal areas of uptake.  Please note the exam was suboptimal and that it was done in a lighted hallway. Oropharynx is clear. Neck is nontender and supple without adenopathy or JVD. Back is nontender and there is no CVA tenderness. Lungs are clear without rales, wheezes, or rhonchi. Chest is nontender. Heart has regular rate and rhythm without murmur. Abdomen is soft, flat, nontender without masses or hepatosplenomegaly and peristalsis is normoactive. Extremities have no cyanosis or edema, full range of motion is present. Skin is warm and dry without rash. Neurologic: Mental status is normal, cranial nerves are intact, there are no motor or sensory deficits.  ED Treatments / Results   Procedures Procedures (including critical care time)  Medications Ordered in ED Medications  fluorescein ophthalmic strip 1 strip (1 strip Left Eye Given by Other 03/25/17 0444)     Initial Impression / Assessment and Plan / ED Course  I have reviewed the triage vital signs and the nursing notes.  Right eye redness and pain.  Old records are reviewed, and he was seen on October 11 with diagnosis of bacterial conjunctivitis and treated with erythromycin ointment.  I wonder if he  might be having an allergic reaction to the erythromycin.  Consider possible iritis, but I do not think that is what he has.  I have discussed case with Dr. Dione Booze, on-call for ophthalmology.  He states that he will see the patient in his office at 1 PM today.  Final Clinical Impressions(s) / ED Diagnoses   Final diagnoses:  Red eye    New Prescriptions New Prescriptions   No medications on file     Dione Booze, MD 03/25/17 [phone]
